# Patient Record
Sex: Female | Born: 1990 | Race: White | Hispanic: No | Marital: Single | Smoking: Never smoker
Health system: Southern US, Community
[De-identification: ages and names within clinical notes are randomized; demographics above are authoritative.]

## PROBLEM LIST (undated history)

## (undated) HISTORY — PX: NASAL SINUS SURGERY: SHX719

---

## 2018-12-21 ENCOUNTER — Emergency Department (HOSPITAL_COMMUNITY): Payer: 59

## 2018-12-21 ENCOUNTER — Other Ambulatory Visit: Payer: Self-pay

## 2018-12-21 ENCOUNTER — Encounter (HOSPITAL_COMMUNITY): Payer: Self-pay | Admitting: Emergency Medicine

## 2018-12-21 ENCOUNTER — Emergency Department (HOSPITAL_COMMUNITY)
Admission: EM | Admit: 2018-12-21 | Discharge: 2018-12-21 | Disposition: A | Discharge status: Home / Self Care | Payer: 59 | Attending: Emergency Medicine | Admitting: Emergency Medicine

## 2018-12-21 DIAGNOSIS — S61211A Laceration without foreign body of left index finger without damage to nail, initial encounter: Secondary | ICD-10-CM | POA: Diagnosis present

## 2018-12-21 DIAGNOSIS — Z23 Encounter for immunization: Secondary | ICD-10-CM | POA: Diagnosis not present

## 2018-12-21 DIAGNOSIS — W290XXA Contact with powered kitchen appliance, initial encounter: Secondary | ICD-10-CM | POA: Diagnosis not present

## 2018-12-21 DIAGNOSIS — Y999 Unspecified external cause status: Secondary | ICD-10-CM | POA: Insufficient documentation

## 2018-12-21 DIAGNOSIS — Y93G1 Activity, food preparation and clean up: Secondary | ICD-10-CM | POA: Insufficient documentation

## 2018-12-21 DIAGNOSIS — Y92000 Kitchen of unspecified non-institutional (private) residence as  the place of occurrence of the external cause: Secondary | ICD-10-CM | POA: Diagnosis not present

## 2018-12-21 MED ORDER — CEPHALEXIN 500 MG PO CAPS: 500.0000 mg | ORAL_CAPSULE | Freq: Two times a day (BID) | ORAL | 0 refills | Status: AC

## 2018-12-21 MED ORDER — CEPHALEXIN 500 MG PO CAPS
500.0000 mg | ORAL_CAPSULE | Freq: Once | ORAL | Status: AC
  Administered 2018-12-21: 500 mg via ORAL
  Filled 2018-12-21: qty 1

## 2018-12-21 MED ORDER — LIDOCAINE HCL (PF) 1 % IJ SOLN
10.0000 mL | Freq: Once | INTRAMUSCULAR | Status: AC
  Administered 2018-12-21: 10 mL

## 2018-12-21 MED ORDER — TETANUS-DIPHTH-ACELL PERTUSSIS 5-2.5-18.5 LF-MCG/0.5 IM SUSP
0.5000 mL | Freq: Once | INTRAMUSCULAR | Status: AC
  Administered 2018-12-21: 0.5 mL via INTRAMUSCULAR
  Filled 2018-12-21: qty 0.5

## 2018-12-21 NOTE — ED Provider Notes (Signed)
[facility]-EMERGENCY DEPT Provider Note   CSN: [id number] Arrival date & time: 12/21/18  1816     History   Chief Complaint Chief Complaint  Patient presents with  . Laceration    HPI Deshon Hovsepian is a 28 y.o. female who presents to the ED for a laceration to the left index finger that occurred approximately 6 pm. Patient reports she cut her finger with a blade in her blender. Patient accidentally hit the on button while her hand was in the blender.    HPI  History reviewed. No pertinent past medical history.  There are no active problems to display for this patient.   Past Surgical History:  Procedure Laterality Date  . NASAL SINUS SURGERY       OB History   No obstetric history on file.      Home Medications    Prior to Admission medications   Medication Sig Start Date End Date Taking? Authorizing Provider  cephALEXin (KEFLEX) 500 MG capsule Take 1 capsule (500 mg total) by mouth 2 (two) times daily. 12/21/18   Janne Napoleon, NP    Family History No family history on file.  Social History Social History   Tobacco Use  . Smoking status: Never Smoker  . Smokeless tobacco: Never Used  Substance Use Topics  . Alcohol use: Yes  . Drug use: Not on file     Allergies   Penicillins   Review of Systems Review of Systems  Musculoskeletal: Positive for arthralgias.  Skin: Positive for wound.  All other systems reviewed and are negative.    Physical Exam Updated Vital Signs BP 121/68   Pulse 88   Temp 98.4 F (36.9 C) (Oral)   Resp 18   Ht 5\' 9"  (1.753 m)   Wt 83.9 kg   LMP 11/29/2018   SpO2 99%   BMI 27.32 kg/m   Physical Exam Vitals signs and nursing note reviewed.  Constitutional:      General: She is not in acute distress.    Appearance: She is well-developed.  HENT:     Head: Normocephalic.  Neck:     Musculoskeletal: Neck supple.  Cardiovascular:     Rate and Rhythm: Normal rate.  Pulmonary:   Effort: Pulmonary effort is normal.  Musculoskeletal:     Left hand: She exhibits tenderness, laceration and swelling. She exhibits normal range of motion and no deformity. Normal sensation noted. Normal strength noted.       Hands:  Skin:    General: Skin is warm and dry.  Neurological:     Mental Status: She is alert and oriented to person, place, and time.  Psychiatric:        Mood and Affect: Mood normal.      ED Treatments / Results  Labs (all labs ordered are listed, but only abnormal results are displayed) Labs Reviewed - No data to display  Radiology Dg Finger Index Left  Result Date: 12/21/2018 CLINICAL DATA:  Laceration. EXAM: LEFT INDEX FINGER 2+V COMPARISON:  December 21, 2018 FINDINGS: The laceration is again identified. No fracture, dislocation, or foreign body. IMPRESSION: Negative. Electronically Signed   By: Gerome Sam III M.D   On: 12/21/2018 21:44   Dg Finger Index Left  Result Date: 12/21/2018 CLINICAL DATA:  Laceration sustained while cleaning blender EXAM: LEFT INDEX FINGER 2+V COMPARISON:  None. FINDINGS: Soft tissue and osseous detail are limited due to overlying gauze/bandaging artifact projecting over the distal phalanx of the left  index finger. No definite cortical destruction is identified to suggest osseous involvement. The soft tissue laceration is difficult to identify due to the overlying bandaging artifact. Suggest repeat imaging after removal of the gauze/bandaging. A faint transverse lucency is seen on the PA view involving the mid distal phalanx which appears to project beyond the cortex is likely artifact. IMPRESSION: Soft tissue and osseous detail is limited due to overlying gauze/bandaging artifact projecting over the distal phalanx of the left index finger. Suggest repeat imaging after removal of the gauze/bandaging. No definite osseous involvement status post laceration. Likewise the laceration of the soft tissues difficult to identify due to  bandaging artifact. Electronically Signed   By: Tollie Ethavid  Kwon M.D.   On: 12/21/2018 18:50    Procedures .Marland Kitchen.Laceration Repair Date/Time: 12/21/2018 9:22 PM Performed by: Janne NapoleonNeese, Hope M, NP Authorized by: Janne NapoleonNeese, Hope M, NP   Consent:    Consent obtained:  Verbal   Consent given by:  Patient   Risks discussed:  Infection and poor wound healing   Alternatives discussed:  No treatment Anesthesia (see MAR for exact dosages):    Anesthesia method:  Nerve block   Block needle gauge:  25 G   Block anesthetic:  Lidocaine 1% w/o epi   Block injection procedure:  Anatomic landmarks identified, introduced needle, incremental injection, anatomic landmarks palpated and negative aspiration for blood Laceration details:    Location:  Finger   Finger location:  L index finger   Length (cm):  2.5 Repair type:    Repair type:  Simple Pre-procedure details:    Preparation:  Patient was prepped and draped in usual sterile fashion and imaging obtained to evaluate for foreign bodies Exploration:    Hemostasis achieved with:  Direct pressure   Wound exploration: entire depth of wound probed and visualized     Wound extent: no tendon damage noted and no underlying fracture noted     Contaminated: no   Treatment:    Area cleansed with:  Saline   Amount of cleaning:  Standard   Irrigation solution:  Sterile saline   Irrigation method:  Syringe Skin repair:    Repair method:  Sutures   Suture size:  5-0   Suture material:  Fast-absorbing gut   Suture technique:  Simple interrupted   Number of sutures:  6 Approximation:    Approximation:  Close Post-procedure details:    Dressing:  Non-adherent dressing and bulky dressing   Patient tolerance of procedure:  Tolerated well, no immediate complications Comments:     Tetanus updated, splint for finger.   (including critical care time)  Medications Ordered in ED Medications  lidocaine (PF) (XYLOCAINE) 1 % injection 10 mL (10 mLs Infiltration Given  12/21/18 2141)  Tdap (BOOSTRIX) injection 0.5 mL (0.5 mLs Intramuscular Given 12/21/18 2141)  cephALEXin (KEFLEX) capsule 500 mg (500 mg Oral Given 12/21/18 2141)     Initial Impression / Assessment and Plan / ED Course  I have reviewed the triage vital signs and the nursing notes. 28 y.o. female here for laceration to the left index finger stable for d/c without fracture or dislocation noted on x-ray. Return precautions discussed with the patient.  Final Clinical Impressions(s) / ED Diagnoses   Final diagnoses:  Laceration of left index finger without foreign body without damage to nail, initial encounter    ED Discharge Orders         Ordered    cephALEXin (KEFLEX) 500 MG capsule  2 times daily  12/21/18 2121           Janne NapoleonNeese, Hope M, NP 12/21/18 2210    Pricilla LovelessGoldston, Scott, MD 12/21/18 [phone]

## 2018-12-21 NOTE — Discharge Instructions (Addendum)
Take tylenol and ibuprofen as needed for pain. Sutures will dissolve or you can have them removed in one week. Return for any signs of infection.

## 2018-12-21 NOTE — ED Triage Notes (Addendum)
Pt reports cut left index finger on emersion blender when didn't turn it off when making pesto.

## 2020-04-16 IMAGING — DX DG FINGER INDEX 2+V*L*
3 series · 3 of 3 positions shown · non-contrast
Comparison: None.

CLINICAL DATA: Laceration sustained while cleaning blender

EXAM:
LEFT INDEX FINGER 2+V

[finger ap]
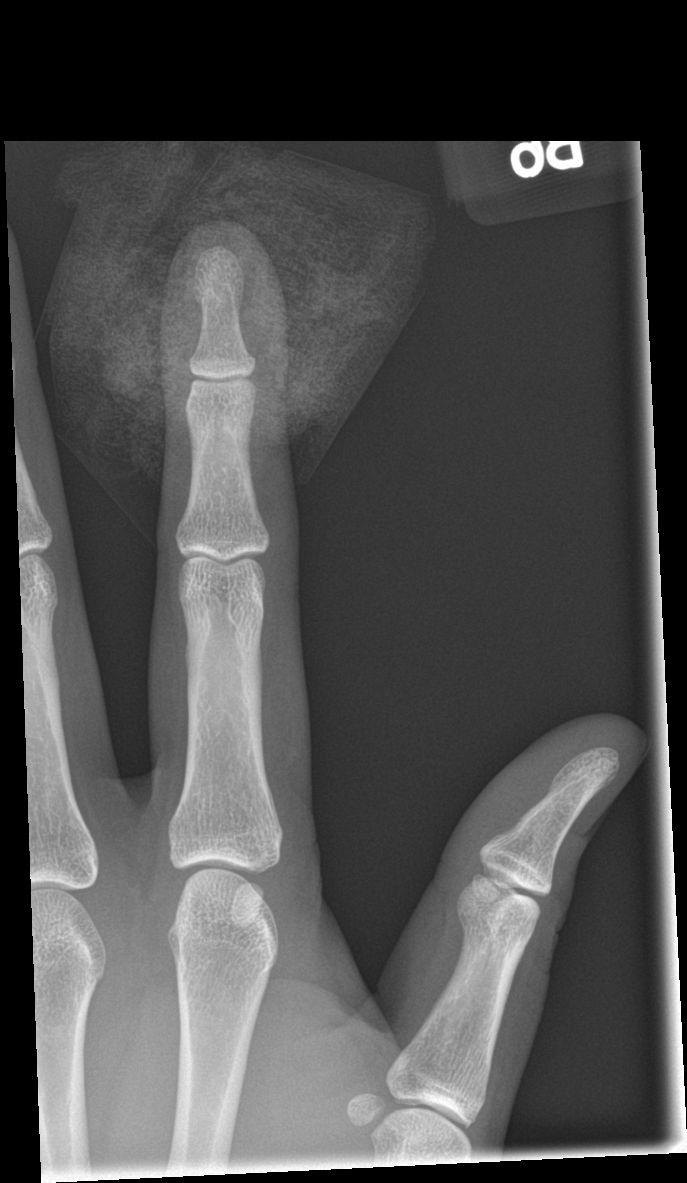

[finger obl]
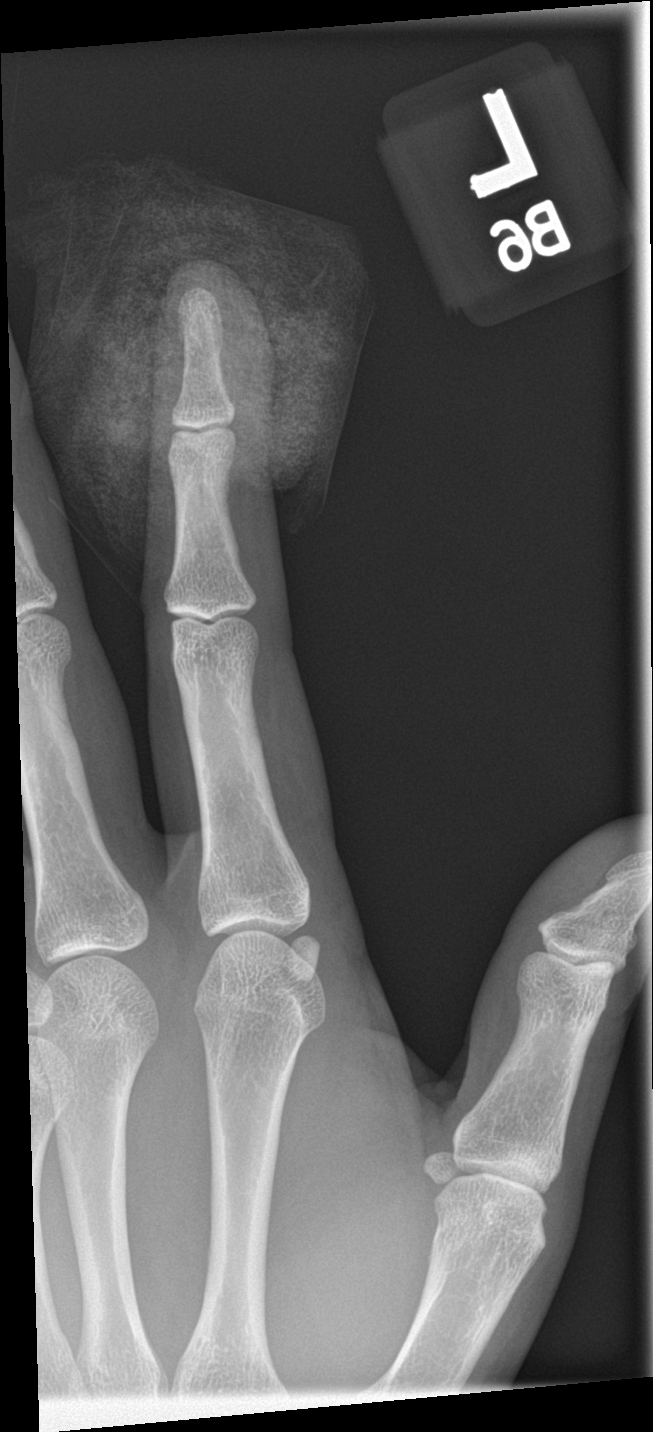

[finger lat]
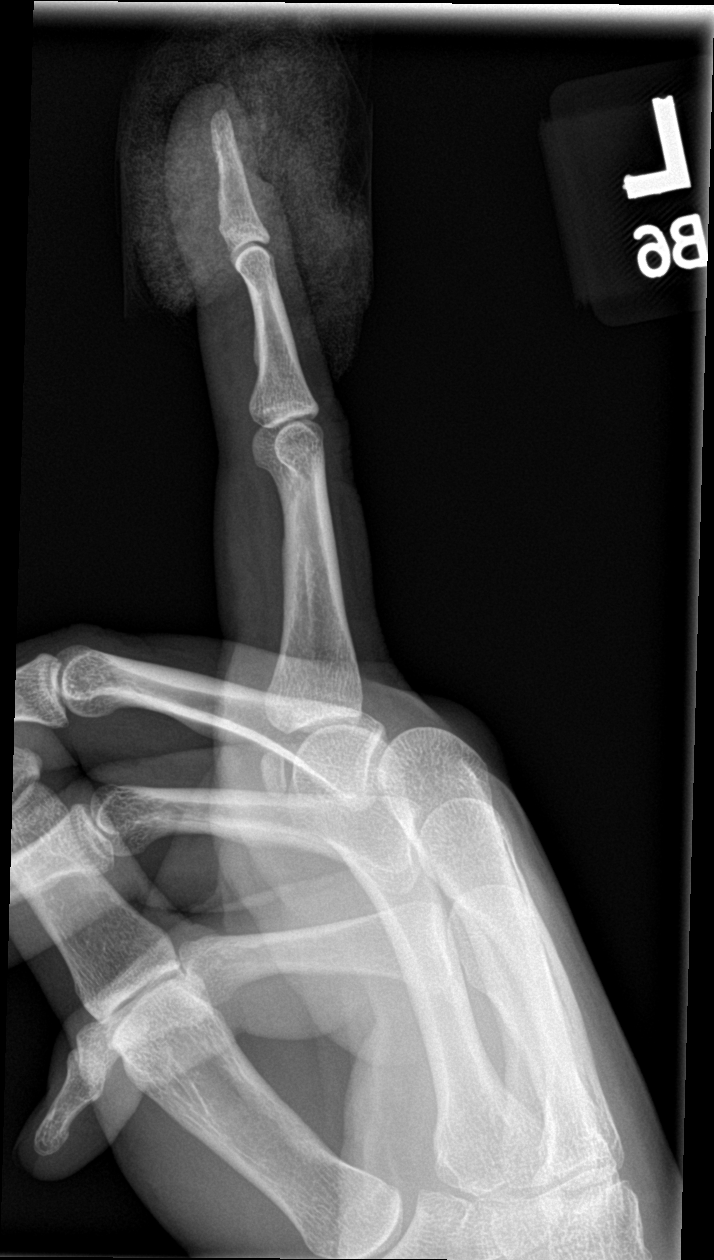

[3 of 3 positions shown; findings below may reference images not displayed]

FINDINGS: Soft tissue and osseous detail are limited due to overlying
gauze/bandaging artifact projecting over the distal phalanx of the
left index finger. No definite cortical destruction is identified to
suggest osseous involvement. The soft tissue laceration is difficult
to identify due to the overlying bandaging artifact. Suggest repeat
imaging after removal of the gauze/bandaging. A faint transverse
lucency is seen on the PA view involving the mid distal phalanx
which appears to project beyond the cortex is likely artifact.
IMPRESSION: Soft tissue and osseous detail is limited due to overlying
gauze/bandaging artifact projecting over the distal phalanx of the
left index finger. Suggest repeat imaging after removal of the
gauze/bandaging. No definite osseous involvement status post
laceration. Likewise the laceration of the soft tissues difficult to
identify due to bandaging artifact.
